# Patient Record
Sex: Male | Born: 1980 | Race: Black or African American | Hispanic: No | Marital: Single | Smoking: Current every day smoker
Health system: Southern US, Community
[De-identification: ages and names within clinical notes are randomized; demographics above are authoritative.]

## PROBLEM LIST (undated history)

## (undated) HISTORY — PX: FRACTURE SURGERY: SHX138

---

## 1999-03-18 ENCOUNTER — Emergency Department (HOSPITAL_COMMUNITY): Admission: EM | Admit: 1999-03-18 | Discharge: 1999-03-18 | Payer: Self-pay | Admitting: *Deleted

## 1999-11-05 ENCOUNTER — Emergency Department (HOSPITAL_COMMUNITY): Admission: EM | Admit: 1999-11-05 | Discharge: 1999-11-05 | Payer: Self-pay

## 2000-02-11 ENCOUNTER — Emergency Department (HOSPITAL_COMMUNITY): Admission: EM | Admit: 2000-02-11 | Discharge: 2000-02-11 | Payer: Self-pay | Admitting: Emergency Medicine

## 2000-04-17 ENCOUNTER — Emergency Department (HOSPITAL_COMMUNITY): Admission: EM | Admit: 2000-04-17 | Discharge: 2000-04-17 | Payer: Self-pay | Admitting: Internal Medicine

## 2001-05-24 ENCOUNTER — Emergency Department (HOSPITAL_COMMUNITY): Admission: EM | Admit: 2001-05-24 | Discharge: 2001-05-25 | Payer: Self-pay

## 2001-08-18 ENCOUNTER — Ambulatory Visit (HOSPITAL_BASED_OUTPATIENT_CLINIC_OR_DEPARTMENT_OTHER): Admission: RE | Admit: 2001-08-18 | Discharge: 2001-08-18 | Payer: Self-pay | Admitting: Orthopedic Surgery

## 2001-08-18 ENCOUNTER — Emergency Department (HOSPITAL_COMMUNITY): Admission: EM | Admit: 2001-08-18 | Discharge: 2001-08-18 | Payer: Self-pay | Admitting: *Deleted

## 2001-08-25 ENCOUNTER — Encounter: Admission: RE | Admit: 2001-08-25 | Discharge: 2001-11-23 | Payer: Self-pay | Admitting: Orthopedic Surgery

## 2003-10-03 ENCOUNTER — Emergency Department (HOSPITAL_COMMUNITY): Admission: EM | Admit: 2003-10-03 | Discharge: 2003-10-03 | Payer: Self-pay | Admitting: Emergency Medicine

## 2003-10-28 ENCOUNTER — Emergency Department (HOSPITAL_COMMUNITY): Admission: EM | Admit: 2003-10-28 | Discharge: 2003-10-28 | Payer: Self-pay | Admitting: Emergency Medicine

## 2004-11-22 ENCOUNTER — Emergency Department (HOSPITAL_COMMUNITY): Admission: EM | Admit: 2004-11-22 | Discharge: 2004-11-23 | Payer: Self-pay | Admitting: Emergency Medicine

## 2004-11-25 ENCOUNTER — Ambulatory Visit (HOSPITAL_COMMUNITY): Admission: RE | Admit: 2004-11-25 | Discharge: 2004-11-25 | Payer: Self-pay | Admitting: Otolaryngology

## 2004-12-17 ENCOUNTER — Ambulatory Visit (HOSPITAL_COMMUNITY): Admission: RE | Admit: 2004-12-17 | Discharge: 2004-12-17 | Payer: Self-pay | Admitting: Otolaryngology

## 2005-01-05 ENCOUNTER — Emergency Department (HOSPITAL_COMMUNITY): Admission: EM | Admit: 2005-01-05 | Discharge: 2005-01-05 | Payer: Self-pay | Admitting: Emergency Medicine

## 2005-10-31 ENCOUNTER — Emergency Department (HOSPITAL_COMMUNITY): Admission: EM | Admit: 2005-10-31 | Discharge: 2005-10-31 | Payer: Self-pay | Admitting: Emergency Medicine

## 2008-01-02 ENCOUNTER — Emergency Department (HOSPITAL_COMMUNITY): Admission: EM | Admit: 2008-01-02 | Discharge: 2008-01-02 | Payer: Self-pay | Admitting: Emergency Medicine

## 2008-01-04 ENCOUNTER — Inpatient Hospital Stay (HOSPITAL_COMMUNITY): Admission: RE | Admit: 2008-01-04 | Discharge: 2008-01-07 | Payer: Self-pay | Admitting: Orthopaedic Surgery

## 2008-01-04 ENCOUNTER — Emergency Department (HOSPITAL_COMMUNITY): Admission: EM | Admit: 2008-01-04 | Discharge: 2008-01-04 | Payer: Self-pay | Admitting: Emergency Medicine

## 2008-01-07 ENCOUNTER — Emergency Department (HOSPITAL_COMMUNITY): Admission: EM | Admit: 2008-01-07 | Discharge: 2008-01-07 | Payer: Self-pay | Admitting: Emergency Medicine

## 2009-05-06 ENCOUNTER — Inpatient Hospital Stay (HOSPITAL_COMMUNITY): Admission: EM | Admit: 2009-05-06 | Discharge: 2009-05-08 | Payer: Self-pay | Admitting: Emergency Medicine

## 2010-05-24 ENCOUNTER — Inpatient Hospital Stay (INDEPENDENT_AMBULATORY_CARE_PROVIDER_SITE_OTHER)
Admission: RE | Admit: 2010-05-24 | Discharge: 2010-05-24 | Disposition: A | Discharge status: Home / Self Care | Payer: Self-pay | Source: Ambulatory Visit | Attending: Family Medicine | Admitting: Family Medicine

## 2010-05-24 DIAGNOSIS — S058X9A Other injuries of unspecified eye and orbit, initial encounter: Secondary | ICD-10-CM

## 2010-06-01 LAB — BASIC METABOLIC PANEL
Calcium: 8.4 mg/dL (ref 8.4–10.5)
Creatinine, Ser: 0.91 mg/dL (ref 0.4–1.5)
GFR calc Af Amer: >60 mL/min (ref >60)

## 2010-07-21 NOTE — Op Note (Signed)
Nathaniel Werner, Nathaniel Werner             ACCOUNT NO.:  [id number]   MEDICAL RECORD NO.:  [id number]          PATIENT TYPE:  INP   LOCATION:  3041                         FACILITY:  [facility]   PHYSICIAN:  Mark C. Ophelia Charter, M.D.    DATE OF BIRTH:  06-May-1980   DATE OF PROCEDURE:  01/04/2008  DATE OF DISCHARGE:                               OPERATIVE REPORT   PREOPERATIVE DIAGNOSIS:  Left hand volar abscess.   POSTOPERATIVE DIAGNOSIS:  Left hand volar abscess.   PROCEDURE:  Incision and fixed excisional debridement of left hand volar  abscess.   SURGEON:  Mark C. Ophelia Charter, MD   TOURNIQUET TIME:  30 minutes.   DRAINS:  None.   OPERATIVE PROCEDURE:  After induction of general anesthesia __________  placement of proximal arm tourniquet, standard prep with DuraPrep,  clindamycin was being dripped and after incision was made.  After time-  out, procedure was confirmed and a V-shaped incision was made over the  fifth MCP, had incorporating the purulent 3 x 3 cm abscess that was  tenting the dermis.  Medially purulent material was noted and cultures  were obtained and once clindamycin was finished dripping, the tourniquet  was inflated for visualization.  Flap stitch was placed in the corner  with 4-0 nylon and then a copious irrigation was performed.  Spreading  with blunt end of the hemostat in the midline down to the flexor tendon  sheath was performed.  Copious irrigation was then performed.  Once the  wound was cleaned, small #15 blade was used to make a small stab in the  sheath and there was no purulence and the sheath tendon looked normal,  finger was flexion extended.  Inspection more proximally showed some the  necrotic subcutaneous palmar fat which was thick removed.  Care was  taken with the neurovascular bundles, repeat irrigation was performed,  some milking from proximal performed with no evidence of the abscess  extended up into the hypothenar bursa.  Milking with the finger revealed  no evidence of extension up into the region of the proximal phalanx of  the finger.  After finishing out the irrigation with bulb syringe of 1  liter saline, some simple sutures were placed reapproximating them to  leave room for any egress of fluids.  Xeroform, 4 x 4s, __________,  Princess Perna, and Coban was applied for postoperative soft dressing.  Instrument and needle count was correct.  The patient tolerated the  procedure well.        Mark C. Ophelia Charter, M.D.  Electronically Signed     MCY/MEDQ  D:  01/04/2008  T:  01/05/2008  Job:  [id number]
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
## 2010-12-07 LAB — CBC
HCT: 38 — ABNORMAL LOW
HCT: 44.7
MCHC: 33.4
MCHC: 33.6
MCV: 96.3
MCV: 96.7
Platelets: 229
RBC: 3.93 — ABNORMAL LOW
RBC: 4.64
RDW: 12.2
RDW: 12.3
WBC: 12 — ABNORMAL HIGH
WBC: 6.1

## 2010-12-07 LAB — CULTURE, ROUTINE-ABSCESS

## 2010-12-07 LAB — DIFFERENTIAL
Basophils Absolute: 0
Basophils Relative: 0
Eosinophils Relative: 1
Lymphocytes Relative: 30
Monocytes Absolute: 1
Neutro Abs: 7.2
Neutrophils Relative %: 60

## 2010-12-07 LAB — ANAEROBIC CULTURE

## 2011-04-19 ENCOUNTER — Emergency Department (HOSPITAL_COMMUNITY)
Admission: EM | Admit: 2011-04-19 | Discharge: 2011-04-19 | Discharge status: Against Medical Advice | Payer: Self-pay | Attending: Emergency Medicine | Admitting: Emergency Medicine

## 2011-04-19 DIAGNOSIS — Z0389 Encounter for observation for other suspected diseases and conditions ruled out: Secondary | ICD-10-CM | POA: Insufficient documentation

## 2014-03-09 ENCOUNTER — Encounter (HOSPITAL_COMMUNITY): Payer: Self-pay | Admitting: Emergency Medicine

## 2014-03-09 ENCOUNTER — Emergency Department (HOSPITAL_COMMUNITY)
Admission: EM | Admit: 2014-03-09 | Discharge: 2014-03-09 | Disposition: A | Discharge status: Home / Self Care | Payer: Self-pay | Attending: Emergency Medicine | Admitting: Emergency Medicine

## 2014-03-09 DIAGNOSIS — K029 Dental caries, unspecified: Secondary | ICD-10-CM | POA: Insufficient documentation

## 2014-03-09 DIAGNOSIS — J029 Acute pharyngitis, unspecified: Secondary | ICD-10-CM | POA: Insufficient documentation

## 2014-03-09 DIAGNOSIS — Z720 Tobacco use: Secondary | ICD-10-CM | POA: Insufficient documentation

## 2014-03-09 DIAGNOSIS — K0889 Other specified disorders of teeth and supporting structures: Secondary | ICD-10-CM

## 2014-03-09 DIAGNOSIS — K088 Other specified disorders of teeth and supporting structures: Secondary | ICD-10-CM | POA: Insufficient documentation

## 2014-03-09 MED ORDER — IBUPROFEN 200 MG PO TABS
600.0000 mg | ORAL_TABLET | Freq: Once | ORAL | Status: AC
  Administered 2014-03-09: 600 mg via ORAL
  Filled 2014-03-09: qty 3

## 2014-03-09 MED ORDER — HYDROCODONE-ACETAMINOPHEN 5-325 MG PO TABS: 1.0000 | ORAL_TABLET | ORAL | Status: DC | PRN

## 2014-03-09 MED ORDER — PENICILLIN V POTASSIUM 500 MG PO TABS: 500.0000 mg | ORAL_TABLET | Freq: Four times a day (QID) | ORAL | Status: DC

## 2014-03-09 MED ORDER — IBUPROFEN 800 MG PO TABS: 800.0000 mg | ORAL_TABLET | Freq: Three times a day (TID) | ORAL | Status: DC | PRN

## 2014-03-09 NOTE — ED Notes (Addendum)
Patient chipped his tooth on the left side approximately a year ago. Now is c/o severe left upper dental pain. Has not taken any pain medicine today. Took BC powder without alleviation of symptoms. RR even-unlabored. No other questions/concerns. Does not have a dentist and has not seen once since chipping his tooth.

## 2014-03-09 NOTE — ED Provider Notes (Signed)
CSN: [id number]     Arrival date & time 03/09/14  1212 History  This chart was scribed for Trixie Dredge, PA-C, working with Toy Cookey, MD by Elon Spanner, ED Scribe. This patient was seen in room WTR5/WTR5 and the patient's care was started at 1:08 PM.   Chief Complaint  Patient presents with  . Dental Pain   The history is provided by the patient. No language interpreter was used.   HPI Comments: Nathaniel Werner is a 34 y.o. male who presents to the Emergency Department complaining of intermittent 10/10 left upper dental pain for several years with worsening the past month and significantly over the past three days.  He describes the pain as aching/throbbing and states it is aggravated by eating and drinking. He also reports an associated sore throat onset this morning.    Patient has taken a BC powder this morning without improvement.  Patient denies dental drainage, facial swelling, sinus congestion, cough, difficulty swallowing or breathing, or fevers.     History reviewed. No pertinent past medical history. Past Surgical History  Procedure Laterality Date  . Fracture surgery     History reviewed. No pertinent family history. History  Substance Use Topics  . Smoking status: Current Every Day Smoker -- 1.00 packs/day    Types: Cigarettes  . Smokeless tobacco: Not on file  . Alcohol Use: Yes     Comment: occasionally    Review of Systems  Constitutional: Negative for fever and chills.  HENT: Positive for dental problem and sore throat. Negative for congestion, facial swelling, sinus pressure and trouble swallowing.   Respiratory: Negative for cough.   Gastrointestinal: Negative for nausea and vomiting.  Musculoskeletal: Negative for myalgias, neck pain and neck stiffness.  Skin: Negative for color change and wound.  Allergic/Immunologic: Negative for immunocompromised state.      Allergies  Morphine and related  Home Medications   Prior to Admission medications    Medication Sig Start Date End Date Taking? Authorizing Provider  Aspirin-Salicylamide-Caffeine (BC HEADACHE POWDER PO) Take 1 each by mouth once.   Yes Historical Provider, MD   BP 160/109 mmHg  Pulse 76  Temp(Src) 98.1 F (36.7 C) (Oral)  Resp 16  SpO2 100% Physical Exam  Constitutional: He appears well-developed and well-nourished. No distress.  HENT:  Head: Normocephalic and atraumatic.  Mouth/Throat: Uvula is midline and oropharynx is clear and moist. Mucous membranes are not dry. No trismus in the jaw. Dental caries present. No uvula swelling. No oropharyngeal exudate, posterior oropharyngeal edema, posterior oropharyngeal erythema or tonsillar abscesses.  Left upper second molar with severe decay and tender to percussion.  No trismus, facial swelling, peritracheal tendneress no edema or erythma of anterior neck.    Neck: Normal range of motion. Neck supple.  No anterior cervical adenopathy.    Cardiovascular: Normal rate, regular rhythm and normal heart sounds.   Pulmonary/Chest: Effort normal and breath sounds normal. No stridor. No respiratory distress. He has no wheezes.  CTA.  No stridor or wheezing.    Lymphadenopathy:    He has no cervical adenopathy.  Neurological: He is alert.  Skin: He is not diaphoretic. No erythema.  Nursing note and vitals reviewed.   ED Course  Procedures (including critical care time)  DIAGNOSTIC STUDIES: Oxygen Saturation is 100% on RA, normal by my interpretation.    COORDINATION OF CARE:  1:11 PM Will order antibiotics and prescribe pain medication.  Patient advised to follow-up with dentist.  Patient acknowledges and agrees with  plan.    Labs Review Labs Reviewed - No data to display  Imaging Review No results found.   EKG Interpretation None      MDM   Final diagnoses:  Pain, dental  Dental caries    Afebrile, nontoxic patient with new dental pain.  No obvious abscess.  No concerning findings on exam.  Doubt deep space  head or neck infection.  Doubt Ludwig's angina.  D/C home with antibiotic, pain medication and dental follow up.  Discussed findings, treatment, and follow up  with patient.  Pt given return precautions.  Pt verbalizes understanding and agrees with plan.       I personally performed the services described in this documentation, which was scribed in my presence. The recorded information has been reviewed and is accurate.   Trixie Dredge, PA-C 03/09/14 1424  Toy Cookey, MD 03/09/14 1721

## 2014-03-09 NOTE — Discharge Instructions (Signed)
Read the information below.  Use the prescribed medication as directed.  Please discuss all new medications with your pharmacist.  Do not take additional tylenol while taking the prescribed pain medication to avoid overdose.  You may return to the Emergency Department at any time for worsening condition or any new symptoms that concern you.  Please call the dentist listed above within 48 hours to schedule a close follow up appointment.  If you develop fevers, swelling in your face, difficulty swallowing or breathing, return to the ER immediately for a recheck.   Dental Caries Dental caries is tooth decay. This decay can cause a hole in teeth (cavity) that can get bigger and deeper over time. HOME CARE  Brush and floss your teeth. Do this at least two times a day.  Use a fluoride toothpaste.  Use a mouth rinse if told by your dentist or doctor.  Eat less sugary and starchy foods. Drink less sugary drinks.  Avoid snacking often on sugary and starchy foods. Avoid sipping often on sugary drinks.  Keep regular checkups and cleanings with your dentist.  Use fluoride supplements if told by your dentist or doctor.  Allow fluoride to be applied to teeth if told by your dentist or doctor. Document Released: 12/02/2007 Document Revised: 07/09/2013 Document Reviewed: 02/25/2012 [facility] Patient Information 2015 [place], [place]. This information is not intended to replace advice given to you by your health care provider. Make sure you discuss any questions you have with your health care provider.  Dental Pain Toothache is pain in or around a tooth. It may get worse with chewing or with cold or heat.  HOME CARE  Your dentist may use a numbing medicine during treatment. If so, you may need to avoid eating until the medicine wears off. Ask your dentist about this.  Only take medicine as told by your dentist or doctor.  Avoid chewing food near the painful tooth until after all treatment is done. Ask  your dentist about this. GET HELP RIGHT AWAY IF:   The problem gets worse or new problems appear.  You have a fever.  There is redness and puffiness (swelling) of the face, jaw, or neck.  You cannot open your mouth.  There is pain in the jaw.  There is very bad pain that is not helped by medicine. MAKE SURE YOU:   Understand these instructions.  Will watch your condition.  Will get help right away if you are not doing well or get worse. Document Released: 08/11/2007 Document Revised: 05/17/2011 Document Reviewed: 08/11/2007 [facility] Patient Information 2015 [place], [place]. This information is not intended to replace advice given to you by your health care provider. Make sure you discuss any questions you have with your health care provider.    Emergency Department Resource Guide 1) Find a Doctor and Pay Out of Pocket Although you won't have to find out who is covered by your insurance plan, it is a good idea to ask around and get recommendations. You will then need to call the office and see if the doctor you have chosen will accept you as a new patient and what types of options they offer for patients who are self-pay. Some doctors offer discounts or will set up payment plans for their patients who do not have insurance, but you will need to ask so you aren't surprised when you get to your appointment.  2) Contact Your Local Health Department Not all health departments have doctors that can see patients for  sick visits, but many do, so it is worth a call to see if yours does. If you don't know where your local health department is, you can check in your phone book. The CDC also has a tool to help you locate your state's health department, and many state websites also have listings of all of their local health departments.  3) Find a Walk-in Clinic If your illness is not likely to be very severe or complicated, you may want to try a walk in clinic. These are popping up all over the  country in pharmacies, drugstores, and shopping centers. They're usually staffed by nurse practitioners or physician assistants that have been trained to treat common illnesses and complaints. They're usually fairly quick and inexpensive. However, if you have serious medical issues or chronic medical problems, these are probably not your best option.  No Primary Care Doctor: - Call Health Connect at  [phone] - they can help you locate a primary care doctor that  accepts your insurance, provides certain services, etc. - Physician Referral Service- [phone]  Chronic Pain Problems: Organization         Address  Phone   Notes  [facility]  [phone] Patients need to be referred by their primary care doctor.   Medication Assistance: Organization         Address  Phone   Notes  [facility] Medication [facility] [address] [place] [place], [place] [address] [phone] --Must be a resident of [facility] -- Must have NO insurance coverage whatsoever (no Medicaid/ Medicare, etc.) -- The pt. MUST have a primary care doctor that directs their care regularly and follows them in the community   [facility]  [phone]   [employer]  [phone]    Agencies that provide inexpensive medical care: Organization         Address  Phone   Notes  [facility]  [phone]   [facility]    [phone]   [facility] [address] [place], [place] [address] [phone]   [facility] [address] [place] [phone]   Planned Parenthood    [phone]   [facility]    [phone]   [facility]  [address], [place] Phone:  [phone], Fax:  [phone] Hours of Operation:  9 am - 6 pm, M-F.  Also accepts Medicaid/Medicare and self-pay.  [facility]  [address], [place] Phone: [phone], Fax: [phone]. Hours of Operation:  8:30 am - 5:30 pm, M-F.  Also accepts Medicaid and self-pay.  [facility] [place] [address] [place] Phone: [phone]   [facility] [address] Natasha Bence [place], [place] [phone], Ext. [phone] Mondays & Thursdays: 7-9 AM.  First 15 patients are seen on a first come, first serve basis.    Medicaid-accepting [facility] Providers:  Organization         Address  Phone   Notes  [facility] [address], [place] [phone] Also accepts self-pay patients.  [facility] [address] Laurell Josephs [place], [place]  [phone]   [facility] [address], [place] [phone]   [facility] [address] [place] [phone]  Renaye Rakers [address], [place]   [phone] Only accepts [place] patients after they have their name applied to their card.   Self-Pay (no insurance) in [facility]:  Organization         Address  Phone   Notes  Sickle Cell Patients, [facility] [address] [place], [place] [phone]   [facility] [address] [place], [place] [phone]   [facility]  [address], [place] [phone]   [facility]/Dr. Osei-Bonsu  [address] [place] or [address], [place] [phone] Phone number for both [place] and [place] locations is the same.  [facility] and [facility] [address] [place] [phone]   [facility] [address] [place] or [address] [phone]   [facility] [address] [place] [phone], phone; [phone], fax Sees patients 1st and 3rd Saturday of every month.  Must  not qualify for public or private insurance (i.e. Medicaid, Medicare, [place] Health Choice, Veterans' Benefits)  Household income should be no more than 200% of the poverty level The clinic cannot treat you if you are pregnant or think you are pregnant  Sexually transmitted diseases are not treated at the clinic.    Dental Care: Organization         Address  Phone  Notes  [facility] Department of [facility] [place] [address] [place], [place] [phone] Accepts children up to age 36 who are enrolled in [place] or [place] Health Choice; pregnant women with a Medicaid card; and children who have applied for Medicaid or [place] Health Choice, but were declined, whose parents can pay a reduced fee at time of service.  [facility]  [address], [place] [phone] Accepts children up to age 52 who are enrolled in [place] or [place] Health Choice; pregnant women with a Medicaid card; and children who have applied for Medicaid or [place] Health Choice, but were declined, whose parents can pay a reduced fee at time of service.  [facility]  [address] [place], [place] [phone] Patients are seen by appointment only. Walk-ins are not accepted. [facility] will see patients 6 years of age and older. Monday - Tuesday (8am-5pm) Most Wednesdays (8:30-5pm) $30 per visit, cash only  [facility] Adult [facility] Access [facility]  [address], [facility] [phone] Patients are seen by appointment only. Walk-ins are not accepted. [facility] will see patients 75 years of age and older. One Wednesday Evening (Monthly: Volunteer Based).  $30 per visit, cash only  [employer]  [phone] for adults; Children under age 57, call Graduate Pediatric Dentistry at [phone]. Children aged 4-14, please call [phone] to request a pediatric application.  Dental services are  provided in all areas of dental care including fillings, crowns and bridges, complete and partial dentures, implants, gum treatment, root canals, and extractions. Preventive care is also provided. Treatment is provided to both adults and children. Patients are selected via a lottery and there is often a waiting list.   [facility] [address] [place]  [phone] [email or web address]   [facility] [address] [place], [place] [phone], Ext.  123 Second and Fourth Thursday of each month, opens at 6:30 AM; Clinic ends at 9 AM.  Patients are seen on a first-come first-served basis, and a limited number are seen during each clinic.   [facility]  [address] Ether Griffins [place], [place] [phone]   Eligibility Requirements You must have lived in [place], [place], or [place] counties for at least the last three months.   You cannot be eligible for state or federal sponsored [place], including [employer], [place], or [employer].   You generally cannot be eligible for healthcare insurance through your employer.    How to apply: Eligibility screenings are held every Tuesday and Wednesday afternoon from 1:00 pm until 4:00 pm. You do not need an appointment for the interview!  [facility] [address] [place], [place] [phone]   [facility] Health Department  [phone]   [facility] Health Department  [phone]   [facility] Health Department  [phone]    Behavioral Health Resources in the Community: Intensive Outpatient Programs Organization         Address  Phone  Notes  [facility] [address] [place], [place] [phone]   [facility] [address], [place], [place] [phone]   [facility] [address] [place], [place]  [phone]   [facility] [address],  [place], [place]  [phone] or [phone]   Substance Abuse Resources Organization         Address  Phone  Notes  [facility]  [phone]   [facility]  [phone]   [facility]  [phone]   Floydene Flock  [phone]   Residential & Outpatient Substance Abuse Program  [phone]   Psychological Services Organization         Address  Phone  Notes  [facility]  [phone]   [facility]  [phone]   [facility] [address], [place] [phone] or [phone]    Mobile Crisis Teams Organization         Address  Phone  Notes  [facility], Mobile Crisis Care Unit  [phone]   [facility]  [address] [place], [place] [phone]   Doristine Locks [address] [place] [phone]    Self-Help/Support Groups Organization         Address  Phone             Notes  [facility] [place] - variety of support groups  336- [phone] Call for more information  Narcotics Anonymous (NA), [facility] [address], [employer] [place]  2 meetings at this location   Statistician         Address  Phone  Notes  [facility] [address],    [place]  [phone]   [facility]  [address] [place] [address], [place], [place] [phone]   [facility] [address] [place], [place] [phone] Admissions: 8am-3pm M-F  [facility] [address],    [place], [place] [phone]   [facility] [address] Starling Manns [place], [place] [phone]   [facility] [address]  [place], [place] [phone]   [facility] - Intensive Outpatient [address] Laurell Josephs [address], [place], [place] [phone]   [facility]  [address]  [place], [place] [phone] or  [phone]   [facility] ([facility]) [address], [place], [place] [phone] Accepts Medicaid  [facility] [address]  [place] [phone] Substance Abuse/Addiction Treatment   [facility] Organization         Address  Phone  Notes  [facility]  [phone]   Angie Fava, PhD [address] Ervin Knack [place], [place]   [phone] or [phone]   [facility] Behavioral   [address] [place], [place] [phone]   [facility] [address], [place], [place] [phone] Insurance/Medicaid/sponsorship through [facility] [address]                                    [place], [place] [phone] Therapy/tele-psych/case  [facility] [address], [place] [phone]    Dr. Lolly Mustache  [phone]   [facility]  [employer] [facility] Dept. 1) [address] [place] 2) [address] [place] 3)  [address], [place] [phone]  [phone]   [facility] Child Abuse Hotline [phone] or [phone] (After Hours)

## 2019-07-30 ENCOUNTER — Ambulatory Visit (HOSPITAL_COMMUNITY): Admission: EM | Admit: 2019-07-30 | Discharge: 2019-07-30 | Discharge status: Against Medical Advice | Payer: Self-pay

## 2019-07-30 ENCOUNTER — Other Ambulatory Visit: Payer: Self-pay

## 2019-09-15 ENCOUNTER — Emergency Department (HOSPITAL_COMMUNITY)
Admission: EM | Admit: 2019-09-15 | Discharge: 2019-09-16 | Disposition: A | Discharge status: Home / Self Care | Payer: Self-pay | Attending: Emergency Medicine | Admitting: Emergency Medicine

## 2019-09-15 ENCOUNTER — Encounter (HOSPITAL_COMMUNITY): Payer: Self-pay | Admitting: Emergency Medicine

## 2019-09-15 ENCOUNTER — Other Ambulatory Visit: Payer: Self-pay

## 2019-09-15 DIAGNOSIS — L089 Local infection of the skin and subcutaneous tissue, unspecified: Secondary | ICD-10-CM | POA: Insufficient documentation

## 2019-09-15 DIAGNOSIS — Z5321 Procedure and treatment not carried out due to patient leaving prior to being seen by health care provider: Secondary | ICD-10-CM | POA: Insufficient documentation

## 2019-09-15 NOTE — ED Triage Notes (Signed)
Pt reports a spider bite that appeared on his head three weeks ago.

## 2019-09-16 NOTE — ED Notes (Signed)
Called pt 2X to be roomed. No answer

## 2019-12-16 ENCOUNTER — Other Ambulatory Visit: Payer: Self-pay

## 2019-12-16 ENCOUNTER — Encounter (HOSPITAL_COMMUNITY): Payer: Self-pay | Admitting: Emergency Medicine

## 2019-12-16 DIAGNOSIS — W57XXXA Bitten or stung by nonvenomous insect and other nonvenomous arthropods, initial encounter: Secondary | ICD-10-CM | POA: Insufficient documentation

## 2019-12-16 DIAGNOSIS — L03116 Cellulitis of left lower limb: Secondary | ICD-10-CM | POA: Insufficient documentation

## 2019-12-16 DIAGNOSIS — Z7982 Long term (current) use of aspirin: Secondary | ICD-10-CM | POA: Insufficient documentation

## 2019-12-16 DIAGNOSIS — F17210 Nicotine dependence, cigarettes, uncomplicated: Secondary | ICD-10-CM | POA: Insufficient documentation

## 2019-12-16 DIAGNOSIS — L02416 Cutaneous abscess of left lower limb: Secondary | ICD-10-CM | POA: Insufficient documentation

## 2019-12-16 NOTE — ED Triage Notes (Addendum)
Pt reports that he thinks a spider bit him on his L knee on Friday. Area is swollen, erythematous, and tender. Swelling covers his whole knee cap. He did not see what bit him. 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

## 2019-12-17 ENCOUNTER — Emergency Department (HOSPITAL_COMMUNITY)
Admission: EM | Admit: 2019-12-17 | Discharge: 2019-12-17 | Disposition: A | Discharge status: Home / Self Care | Payer: Self-pay | Attending: Emergency Medicine | Admitting: Emergency Medicine

## 2019-12-17 ENCOUNTER — Emergency Department (HOSPITAL_COMMUNITY): Payer: Self-pay

## 2019-12-17 DIAGNOSIS — L03116 Cellulitis of left lower limb: Secondary | ICD-10-CM

## 2019-12-17 DIAGNOSIS — L02416 Cutaneous abscess of left lower limb: Secondary | ICD-10-CM

## 2019-12-17 MED ORDER — SULFAMETHOXAZOLE-TRIMETHOPRIM 800-160 MG PO TABS: 1.0000 | ORAL_TABLET | Freq: Two times a day (BID) | ORAL | 0 refills | Status: AC

## 2019-12-17 MED ORDER — SULFAMETHOXAZOLE-TRIMETHOPRIM 800-160 MG PO TABS
1.0000 | ORAL_TABLET | Freq: Once | ORAL | Status: AC
  Administered 2019-12-17: 1 via ORAL
  Filled 2019-12-17: qty 1

## 2019-12-17 MED ORDER — HYDROCODONE-ACETAMINOPHEN 5-325 MG PO TABS
2.0000 | ORAL_TABLET | Freq: Once | ORAL | Status: AC
  Administered 2019-12-17: 2 via ORAL
  Filled 2019-12-17: qty 2

## 2019-12-17 MED ORDER — LIDOCAINE-EPINEPHRINE (PF) 2 %-1:200000 IJ SOLN
20.0000 mL | Freq: Once | INTRAMUSCULAR | Status: AC
  Administered 2019-12-17: 20 mL
  Filled 2019-12-17: qty 20

## 2019-12-17 MED ORDER — CEPHALEXIN 500 MG PO CAPS: 500.0000 mg | ORAL_CAPSULE | Freq: Three times a day (TID) | ORAL | 0 refills | Status: DC

## 2019-12-17 MED ORDER — HYDROCODONE-ACETAMINOPHEN 5-325 MG PO TABS: 1.0000 | ORAL_TABLET | Freq: Four times a day (QID) | ORAL | 0 refills | Status: DC | PRN

## 2019-12-17 MED ORDER — CEPHALEXIN 500 MG PO CAPS
500.0000 mg | ORAL_CAPSULE | Freq: Once | ORAL | Status: AC
  Administered 2019-12-17: 500 mg via ORAL
  Filled 2019-12-17: qty 1

## 2019-12-17 NOTE — ED Provider Notes (Signed)
[facility]-EMERGENC[facility] DEPT Provider Note   CSN: [id number] Arrival date & time: 12/16/19  2336     History Chief Complaint  Patient presents with  . Insect Bite    Nathaniel Werner is a 39 y.o. male.  The history is provided by the patient.  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  He thinks he got bit by a spider in the left knee 2 days ago.  He reports over the past day he has had increased pain and swelling in the left knee.  No fevers or vomiting.  Has never had surgery on his left knee. He is not diabetic     PMH-none Past Surgical History:  Procedure Laterality Date  . FRACTURE SURGERY         History reviewed. No pertinent family history.  Social History   Tobacco Use  . Smoking status: Current Every Day Smoker    Packs/day: 1.00    Types: Cigarettes  Substance Use Topics  . Alcohol use: Yes    Comment: occasionally  . Drug use: Not on file    Home Medications Prior to Admission medications   Medication Sig Start Date End Date Taking? Authorizing Provider  Aspirin-Salicylamide-Caffeine (BC HEADACHE POWDER PO) Take 1 each by mouth once.    [provider]  HYDROcodone-acetaminophen (NORCO/VICODIN) 5-325 MG per tablet Take 1 tablet by mouth every 4 (four) hours as needed for moderate pain or severe pain. 03/09/14   Trixie Dredge, PA-C  ibuprofen (ADVIL,MOTRIN) 800 MG tablet Take 1 tablet (800 mg total) by mouth every 8 (eight) hours as needed for mild pain or moderate pain. 03/09/14   Trixie Dredge, PA-C  penicillin v potassium (VEETID) 500  MG tablet Take 1 tablet (500 mg total) by mouth 4 (four) times daily. 03/09/14   Trixie Dredge, PA-C    Allergies    Morphine and related  Review of Systems   Review of Systems  Constitutional: Negative for fever.  Gastrointestinal: Negative for vomiting.  Skin: Positive for wound.  All other systems reviewed and are negative.   Physical Exam Updated Vital Signs BP 133/88 (BP Location: Left Arm)   Pulse 85   Temp 98.2 F (36.8 C) (Oral)   Resp 18   Ht 1.626 m (5\' 4" )   Wt 68 kg   SpO2 100%   BMI 25.75 kg/m   Physical Exam CONSTITUTIONAL: Well developed/well nourished HEAD: Normocephalic/atraumatic EYES: EOMI ENMT: Mucous membranes moist NECK: supple no meningeal signs CV: S1/S2 noted, no murmurs/rubs/gallops noted LUNGS: Lungs are clear to auscultation bilaterally, no apparent distress ABDOMEN: soft, nontender NEURO: Pt is awake/alert/appropriate, moves all extremitiesx4.  No facial droop.   EXTREMITIES: pulses normal/equal, full ROM, tenderness and swelling to left knee.  He has a fluctuant abscess on the distal femur.  There is surrounding erythema.  No crepitus.  He is able to flex the left knee SKIN: warm, color normal, see photo below PSYCH: no abnormalities of mood noted, alert and oriented to situation  Patient gave verbal permission to utilize photo for medical  documentation only The image was not stored on any personal device       ED Results / Procedures / Treatments   Labs (all labs ordered are listed, but only abnormal results are displayed) Labs Reviewed - No data to display  EKG None  Radiology DG Knee Complete 4 Views Left  Result Date: 12/17/2019 CLINICAL DATA:  Spider bite, left knee, swelling, erythema and tenderness EXAM: LEFT KNEE - COMPLETE 4+ VIEW COMPARISON:  None. FINDINGS: Focal soft tissue swelling is seen anterior to the distal quadriceps tendon and patella with some questionable thickening of the prepatellar bursa as well. Mild  thickening of the distal quadriceps tendon itself. No soft tissue gas or foreign body is seen. No acute bony abnormality. Specifically, no fracture, subluxation, or dislocation. No joint effusion or intra-articular gas. No erosive or destructive changes to suggest features of osteomyelitis. IMPRESSION: Focal edematous swelling anteriorly with thickening of the prepatellar bursa which could reflect a reactive bursitis. Correlate for clinical features of cellulitis. Mild nonspecific thickening of the distal quadriceps tendon itself, could reflect a reactive tenosynovitis which would typically be characterized by exquisite pain with motion. No soft tissue gas or foreign body. No acute osseous abnormality. Electronically Signed   By: Kreg Shropshire M.D.   On: 12/17/2019 00:30    Procedures .Marland KitchenIncision and Drainage  Date/Time: 12/17/2019 12:19 AM Performed by: Zadie Rhine, MD Authorized by: Zadie Rhine, MD   Consent:    Consent obtained:  Verbal   Consent given by:  Patient   Risks discussed:  Incomplete drainage and pain Location:    Type:  Abscess   Location:  Lower extremity   Lower extremity location:  Knee   Knee location:  L knee Pre-procedure details:    Skin preparation:  Betadine and antiseptic wash Procedure type:    Complexity:  Simple Procedure details:    Incision types:  Single straight   Scalpel blade:  11   Wound management:  Extensive cleaning   Drainage:  Purulent   Drainage amount:  Scant   Wound treatment:  Wound left open   Packing materials:  None Post-procedure details:    Patient tolerance of procedure:  Tolerated well, no immediate complications    Medications Ordered in ED Medications  HYDROcodone-acetaminophen (NORCO/VICODIN) 5-325 MG per tablet 2 tablet (2 tablets Oral Given 12/17/19 0023)  sulfamethoxazole-trimethoprim (BACTRIM DS) 800-160 MG per tablet 1 tablet (1 tablet Oral Given 12/17/19 0023)  cephALEXin (KEFLEX) capsule 500 mg (500 mg Oral  Given 12/17/19 0023)  lidocaine-EPINEPHrine (XYLOCAINE W/EPI) 2 %-1:200000 (PF) injection 20 mL (20 mLs Infiltration Given by Other 12/17/19 0023)    ED Course  I have reviewed the triage vital signs and the nursing notes.  Pertinent  imaging results that were available during my care of the patient were reviewed by me and considered in my medical decision making (see chart for details).    MDM Rules/Calculators/A&P                          12:19 AM Suspect patient has cutaneous abscess that is causing localized erythema.  I have low suspicion for septic joint.  Will obtain x-ray of knee and reassess 1:34 AM I personally reviewed the x-ray.  There is no joint effusion.  Strong suspicion this represents a cutaneous abscess.  Bedside ultrasound revealed only minimal fluid in the area of fluctuance.  Made a small incision in that area with small amount of blood and  pus extracted.  Patient is able to flex and extend the knee. He will need to continue dual antibiotic therapy and good wound care.  I advised him that he will likely need to have a second I&D in the  next several days.  Final Clinical Impression(s) / ED Diagnoses Final diagnoses:  Abscess of left knee  Cellulitis of left lower extremity    Rx / DC Orders ED Discharge Orders         Ordered    HYDROcodone-acetaminophen (NORCO/VICODIN) 5-325 MG tablet  Every 6 hours PRN        12/17/19 0132    cephALEXin (KEFLEX) 500 MG capsule  3 times daily        12/17/19 0132    sulfamethoxazole-trimethoprim (BACTRIM DS) 800-160 MG tablet  2 times daily        12/17/19 0132           Zadie Rhine, MD 12/17/19 0136

## 2019-12-17 NOTE — Discharge Instructions (Addendum)
Please keep your leg elevated.  Check your bandage each day to see if it is draining fluid.  You can put warm compresses or warm cloths on the wound several times a day to help draw the fluid out.  Take a photo of the wound each day to make sure it is getting better.  If you have worsening pain, redness or swelling over the next 2 to 3 days please return to the ER

## 2019-12-17 NOTE — ED Notes (Signed)
Opened  Chart at pts request to clarify pharmacy prescriptions were sent to.

## 2020-01-07 ENCOUNTER — Emergency Department (HOSPITAL_COMMUNITY)
Admission: EM | Admit: 2020-01-07 | Discharge: 2020-01-08 | Disposition: A | Discharge status: Home / Self Care | Payer: Self-pay | Attending: Emergency Medicine | Admitting: Emergency Medicine

## 2020-01-07 ENCOUNTER — Other Ambulatory Visit: Payer: Self-pay

## 2020-01-07 DIAGNOSIS — F17210 Nicotine dependence, cigarettes, uncomplicated: Secondary | ICD-10-CM | POA: Diagnosis not present

## 2020-01-07 DIAGNOSIS — G8929 Other chronic pain: Secondary | ICD-10-CM

## 2020-01-07 DIAGNOSIS — G44209 Tension-type headache, unspecified, not intractable: Secondary | ICD-10-CM | POA: Diagnosis not present

## 2020-01-07 DIAGNOSIS — R519 Headache, unspecified: Secondary | ICD-10-CM | POA: Diagnosis present
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
## 2020-01-08 ENCOUNTER — Emergency Department (HOSPITAL_COMMUNITY): Payer: Self-pay

## 2020-01-08 ENCOUNTER — Encounter (HOSPITAL_COMMUNITY): Payer: Self-pay

## 2020-01-08 NOTE — Discharge Instructions (Addendum)
You were seen in the emergency department today for headaches. Your  CT scan did not show any new abnormality such as a brain bleed.  We would like you to follow-up with neurology & the [facility], contact information below:  Address: [address], [place], [place] [address] Phone: [phone]  Per [facility] Website:   What to Expect: Evaluations at the Concussion Clinic All patients at the Concussion Clinic are given an extensive three-part evaluation that includes: a computerized test to measure memory, visual processing speed, and reaction time a test that measures the systems that integrate movement, balance, and vision an in-depth review of a detailed symptoms checklist for signs of concussion The evaluation process is critical for the treatment and recovery of a concussed patient as no two concussions are alike. Thankfully, the diagnostic tools that trained professionals use can help to better manage head injuries.  Part of the technology the doctors and staff at [facility] use in their assessments is a computerized examination called ImPACT. This tool uses six tasks to measure memory, visual processing speed, and reaction time. By analyzing the results of the examination and comparing them to average responses or a baseline score for a patient, our staff can make an informed judgment about the patient's cognitive functions. In addition to the ImPACT examination, patients are given a Vestibular Ocular Motor Screening test. This is a simple and painless test that focuses on the systems that integrate a patient's movement, balance, and vision.  These tests are used in conjunction with a thorough review of a detailed symptoms checklist to complete the patient's evaluation and develop a treatment plan.  You do not need a referral, and you can book an appointment online. Our Concussion Hotline is staffed by trained  professionals during our regular office hours: Monday - Thursday from 7:30 AM to 4:30 PM, and Fridays from 7:30 AM to 12:00 PM, and the number to call is [phone]. The Concussion Clinic team meets with patients at our [facility] office. Call today.   Further ED Instructions:   Please follow up within 1 week. In the meantime we would like you to avoid strenuous/over exertional activities such as sports or running.  Please avoid excess screen time utilizing cell phones, computers, or the TV.  Please avoid activities that require significant amount of concentration.  Please try to rest as much as possible.  Please take Tylenol and/or Motrin per over-the-counter dosing instructions for any continued discomfort.  Return to the ER for new or worsening symptoms or any other concerns that you may have.   Please have your blood pressure rechecked at your follow-up appointment as it was elevated today.  Vitals:   01/07/20 2334  BP: (!) 156/116  Pulse: 96  Resp: 20  Temp: 98.1 F (36.7 C)  SpO2: 100%
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
## 2020-01-28 ENCOUNTER — Encounter: Payer: Self-pay | Admitting: Neurology

## 2020-01-28 ENCOUNTER — Telehealth: Payer: Self-pay
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
## 2020-03-06 ENCOUNTER — Other Ambulatory Visit: Payer: Self-pay

## 2020-03-06 ENCOUNTER — Encounter (HOSPITAL_COMMUNITY): Payer: Self-pay | Admitting: Emergency Medicine

## 2020-03-06 ENCOUNTER — Emergency Department (HOSPITAL_COMMUNITY)
Admission: EM | Admit: 2020-03-06 | Discharge: 2020-03-06 | Disposition: A | Discharge status: Home / Self Care | Payer: Self-pay | Attending: Emergency Medicine | Admitting: Emergency Medicine

## 2020-03-06 DIAGNOSIS — J029 Acute pharyngitis, unspecified: Secondary | ICD-10-CM | POA: Insufficient documentation

## 2020-03-06 DIAGNOSIS — Z5321 Procedure and treatment not carried out due to patient leaving prior to being seen by health care provider: Secondary | ICD-10-CM | POA: Insufficient documentation

## 2020-03-06 LAB — GROUP A STREP BY PCR: Group A Strep by PCR: NOT DETECTED

## 2020-03-06 NOTE — ED Notes (Signed)
Pt called 2x no answer 

## 2020-03-06 NOTE — ED Notes (Addendum)
Pt called 3x no answer  

## 2020-03-06 NOTE — ED Triage Notes (Signed)
Patient reports sore throat with swelling onset last week , no fever or chills , airway intact , respirations unlabored .

## 2020-03-13 ENCOUNTER — Emergency Department (HOSPITAL_COMMUNITY)
Admission: EM | Admit: 2020-03-13 | Discharge: 2020-03-13 | Disposition: A | Discharge status: Home / Self Care | Payer: Self-pay | Attending: Emergency Medicine | Admitting: Emergency Medicine

## 2020-03-13 ENCOUNTER — Other Ambulatory Visit: Payer: Self-pay

## 2020-03-13 ENCOUNTER — Emergency Department (HOSPITAL_COMMUNITY): Payer: Self-pay

## 2020-03-13 DIAGNOSIS — Z20822 Contact with and (suspected) exposure to covid-19: Secondary | ICD-10-CM | POA: Insufficient documentation

## 2020-03-13 DIAGNOSIS — J040 Acute laryngitis: Secondary | ICD-10-CM

## 2020-03-13 DIAGNOSIS — R059 Cough, unspecified: Secondary | ICD-10-CM | POA: Insufficient documentation

## 2020-03-13 DIAGNOSIS — J029 Acute pharyngitis, unspecified: Secondary | ICD-10-CM | POA: Insufficient documentation

## 2020-03-13 DIAGNOSIS — F17210 Nicotine dependence, cigarettes, uncomplicated: Secondary | ICD-10-CM | POA: Insufficient documentation

## 2020-03-13 LAB — GROUP A STREP BY PCR: Group A Strep by PCR: NOT DETECTED

## 2020-03-13 MED ORDER — ACETAMINOPHEN 325 MG PO TABS
650.0000 mg | ORAL_TABLET | Freq: Once | ORAL | Status: AC | PRN
  Administered 2020-03-13: 650 mg via ORAL
  Filled 2020-03-13: qty 2

## 2020-03-13 MED ORDER — ESOMEPRAZOLE MAGNESIUM 40 MG PO CPDR: 40.0000 mg | DELAYED_RELEASE_CAPSULE | Freq: Every day | ORAL | 0 refills | Status: AC

## 2020-03-13 MED ORDER — FAMOTIDINE 20 MG PO TABS
20.0000 mg | ORAL_TABLET | Freq: Once | ORAL | Status: AC
  Administered 2020-03-13: 20 mg via ORAL
  Filled 2020-03-13: qty 1

## 2020-03-13 MED ORDER — ALUM & MAG HYDROXIDE-SIMETH 200-200-20 MG/5ML PO SUSP
30.0000 mL | Freq: Once | ORAL | Status: AC
  Administered 2020-03-13: 30 mL via ORAL
  Filled 2020-03-13: qty 30

## 2020-03-13 MED ORDER — LIDOCAINE VISCOUS HCL 2 % MT SOLN
15.0000 mL | Freq: Once | OROMUCOSAL | Status: AC
  Administered 2020-03-13: 15 mL via ORAL
  Filled 2020-03-13: qty 15

## 2020-03-13 NOTE — ED Triage Notes (Signed)
Pt POV reports sore throat x2 years, but worsening 2 weeks. Reports new cough starting yesterday.

## 2020-03-13 NOTE — ED Notes (Signed)
Pt able to tolerate PO fluids.  

## 2020-03-13 NOTE — ED Notes (Signed)
Pt stated he was waiting on a phone call to be seen from his car. Pt now back in lobby.

## 2020-03-13 NOTE — ED Provider Notes (Signed)
[facility]-EMERGENCY DEPT Provider Note   CSN: [id number] Arrival date & time: 03/13/20  1535     History Chief Complaint  Patient presents with  . Sore Throat  . Cough    Nathaniel Werner is a 40 y.o. male who presented with sore throat, cough.  Patient states that he has been having sore throat for the last 2 years.  He states that over the last 2 weeks, it got worse.  He went to [facility] about a week ago and left without being seen.  Patient has some nonproductive cough as well.  Patient is not vaccinated against COVID and denies any shortness of breath.  Denies any fever.  States that his voice is hoarse but has been hoarse for the last several weeks.   The history is provided by the patient.       No past medical history on file.  There are no problems to display for this patient.   Past Surgical History:  Procedure Laterality Date  . FRACTURE SURGERY         No family history on file.  Social History   Tobacco Use  . Smoking status: Current Every Day Smoker    Packs/day: 1.00    Types: Cigarettes  . Smokeless tobacco: Never Used  Substance Use Topics  . Alcohol use: Yes    Comment: occasionally    Home Medications Prior to Admission medications   Not on File    Allergies    Wasp venom, Blue dyes (parenteral), and Morphine and related  Review of Systems   Review of Systems  HENT: Positive for sore throat.   Respiratory: Positive for cough.   All other systems reviewed and are negative.   Physical Exam Updated Vital Signs BP (!) 142/92   Pulse 98   Temp 98.8 F (37.1 C) (Oral)   Resp 18   Ht 5\' 4"  (1.626 m)   Wt 67.2 kg   SpO2 99%   BMI 25.42 kg/m   Physical Exam Vitals and nursing note reviewed.  Constitutional:      Appearance: He is well-developed.  HENT:     Head: Normocephalic.     Mouth/Throat:     Pharynx: Posterior oropharyngeal erythema present.     Comments: No tonsillar exudates  Eyes:      Conjunctiva/sclera: Conjunctivae normal.     Pupils: Pupils are equal, round, and reactive to light.  Cardiovascular:     Rate and Rhythm: Normal rate and regular rhythm.  Pulmonary:     Effort: Pulmonary effort is normal.     Breath sounds: Normal breath sounds.  Abdominal:     General: Bowel sounds are normal.     Palpations: Abdomen is soft.  Musculoskeletal:     Cervical back: Normal range of motion and neck supple.  Skin:    General: Skin is warm.     Capillary Refill: Capillary refill takes less than 2 seconds.  Neurological:     General: No focal deficit present.     Mental Status: He is alert and oriented to person, place, and time.  Psychiatric:        Mood and Affect: Mood normal.        Behavior: Behavior normal.     ED Results / Procedures / Treatments   Labs (all labs ordered are listed, but only abnormal results are displayed) Labs Reviewed  GROUP A STREP BY PCR  SARS CORONAVIRUS 2 (TAT 6-24 HRS)  EKG None  Radiology No results found.  Procedures Procedures (including critical care time)  Medications Ordered in ED Medications  acetaminophen (TYLENOL) tablet 650 mg (has no administration in time range)  alum & mag hydroxide-simeth (MAALOX/MYLANTA) 200-200-20 MG/5ML suspension 30 mL (has no administration in time range)    And  lidocaine (XYLOCAINE) 2 % viscous mouth solution 15 mL (has no administration in time range)  famotidine (PEPCID) tablet 20 mg (has no administration in time range)    ED Course  I have reviewed the triage vital signs and the nursing notes.  Pertinent labs & imaging results that were available during my care of the patient were reviewed by me and considered in my medical decision making (see chart for details).    MDM Rules/Calculators/A&P                         JATHEN SUDANO is a 40 y.o. male here with cough and sore throat.  Has been going on for years and worse over the last 2 weeks.  I suspect reflux versus vocal  cord dysfunction versus Covid.  Patient is afebrile though and not hypoxic.  Plan to get soft tissue neck x-ray and give GI cocktail and Pepcid and test for Covid  10:21 PM Chest x-ray and neck x-ray unremarkable.  Strep test is negative.  Covid test is sent.  Will start on PPI and told him to stay home until Covid test comes back.  Will refer to ENT.  He may have laryngitis or vocal cord dysfunction or reflux.  He may benefit from Nasopharyngeal scope.   Final Clinical Impression(s) / ED Diagnoses Final diagnoses:  None    Rx / DC Orders ED Discharge Orders    None       Charlynne Pander, MD 03/13/20 2222

## 2020-03-13 NOTE — Discharge Instructions (Addendum)
Take nexium daily.   You may have vocal cord dysfunction or reflux.  Consider following up with a ENT doctor for a scope.  Return to ER if you have worse sore throat, trouble swallowing, neck swelling

## 2020-03-13 NOTE — ED Notes (Signed)
Pt called 3x for room placement. Eloped from waiting area.  

## 2020-03-13 NOTE — ED Notes (Signed)
An After Visit Summary was printed and given to the patient. Discharge instructions given and no further questions at this time.  

## 2020-03-13 NOTE — ED Notes (Signed)
Pt ambulated in room unassisted with a steady gait. Pt O2 reading 99% RA while ambulating.

## 2020-03-14 LAB — SARS CORONAVIRUS 2 (TAT 6-24 HRS): SARS Coronavirus 2: NEGATIVE
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
## 2020-04-09 ENCOUNTER — Ambulatory Visit: Payer: Self-pay | Admitting: Neurology

## 2021-05-10 IMAGING — CR DG CHEST 2V
2 series · 2 of 2 positions shown · non-contrast
Comparison: 02/28/2019

CLINICAL DATA: Sore throat, chest tightness, short of breath,
hoarseness

EXAM:
CHEST - 2 VIEW

[w chest pa]
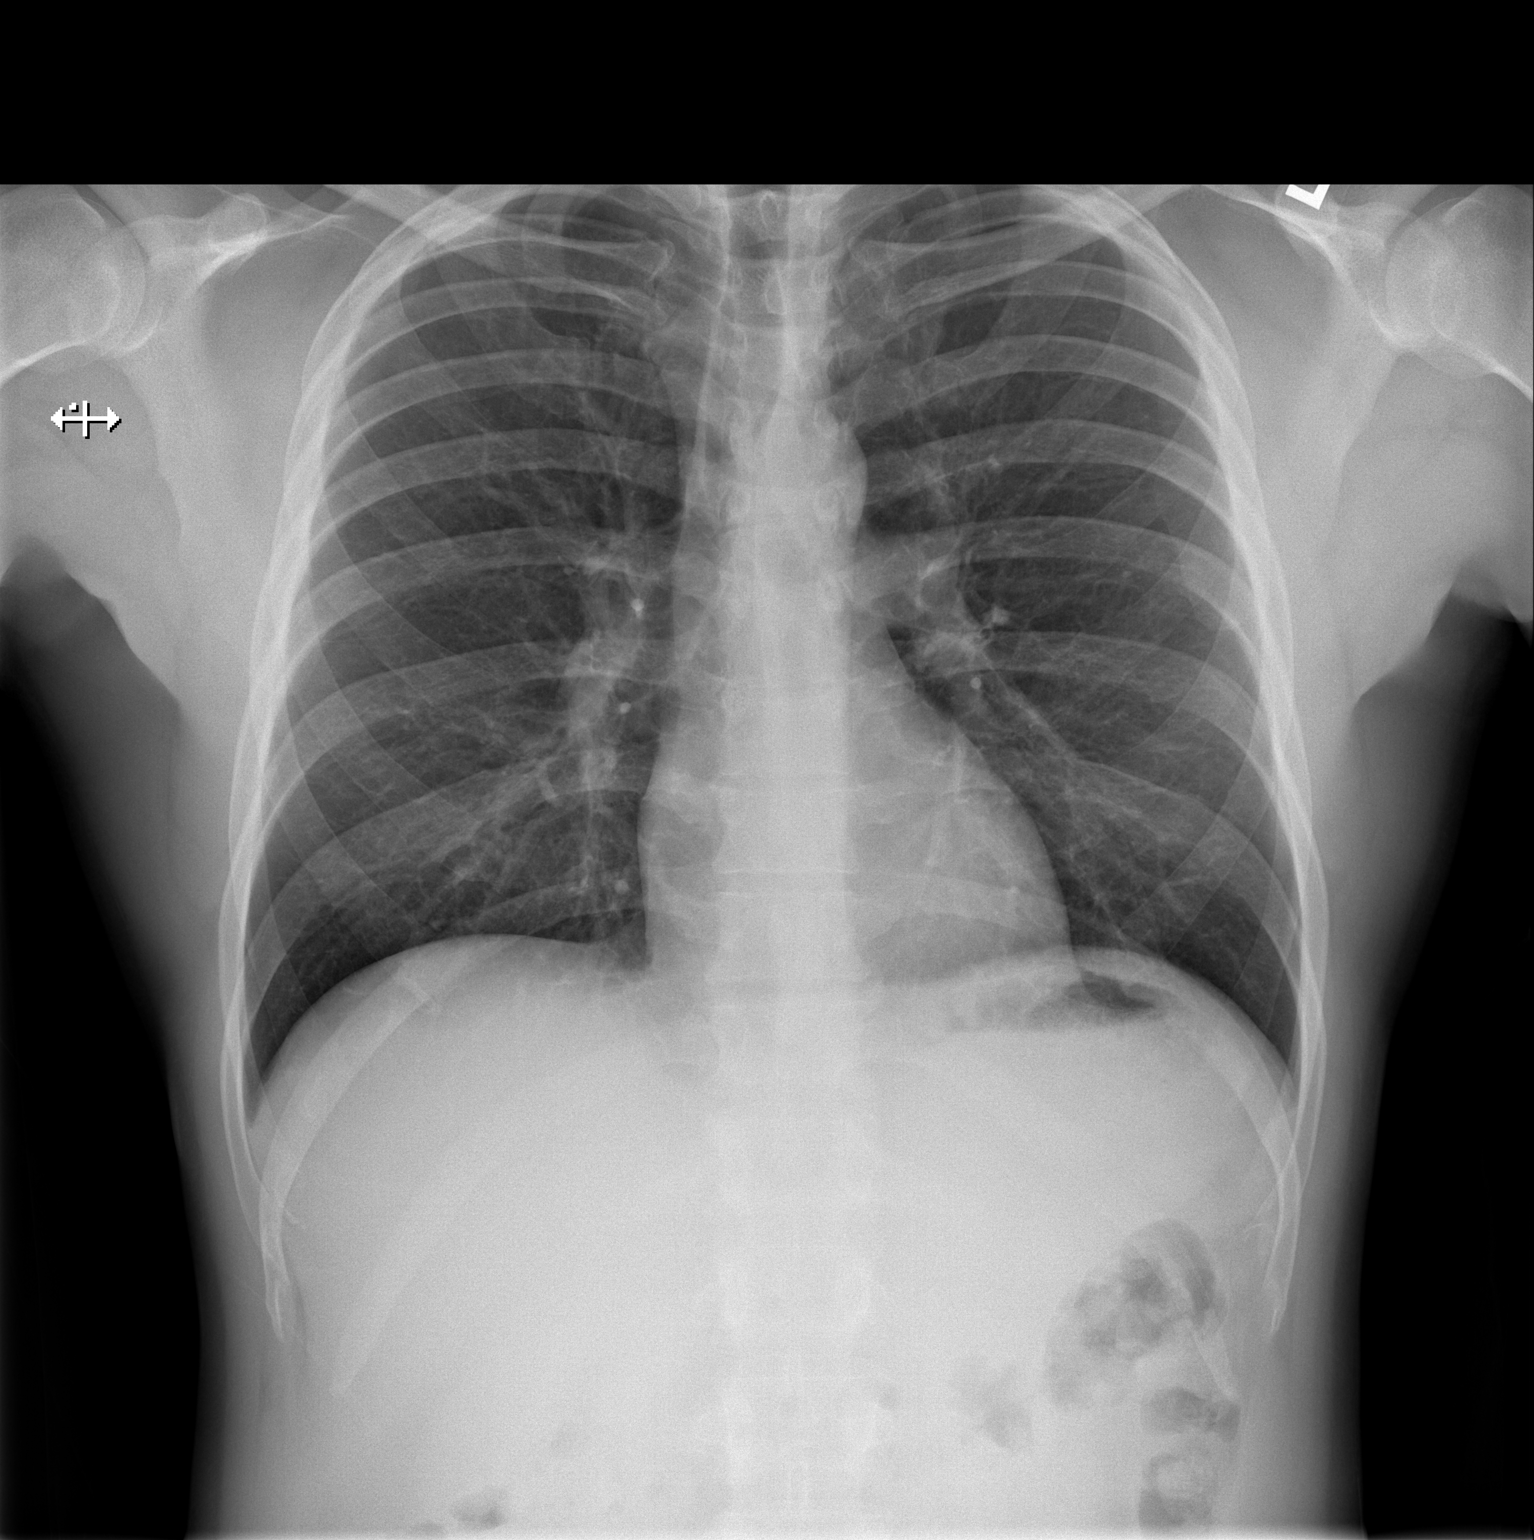

[w chest lat]
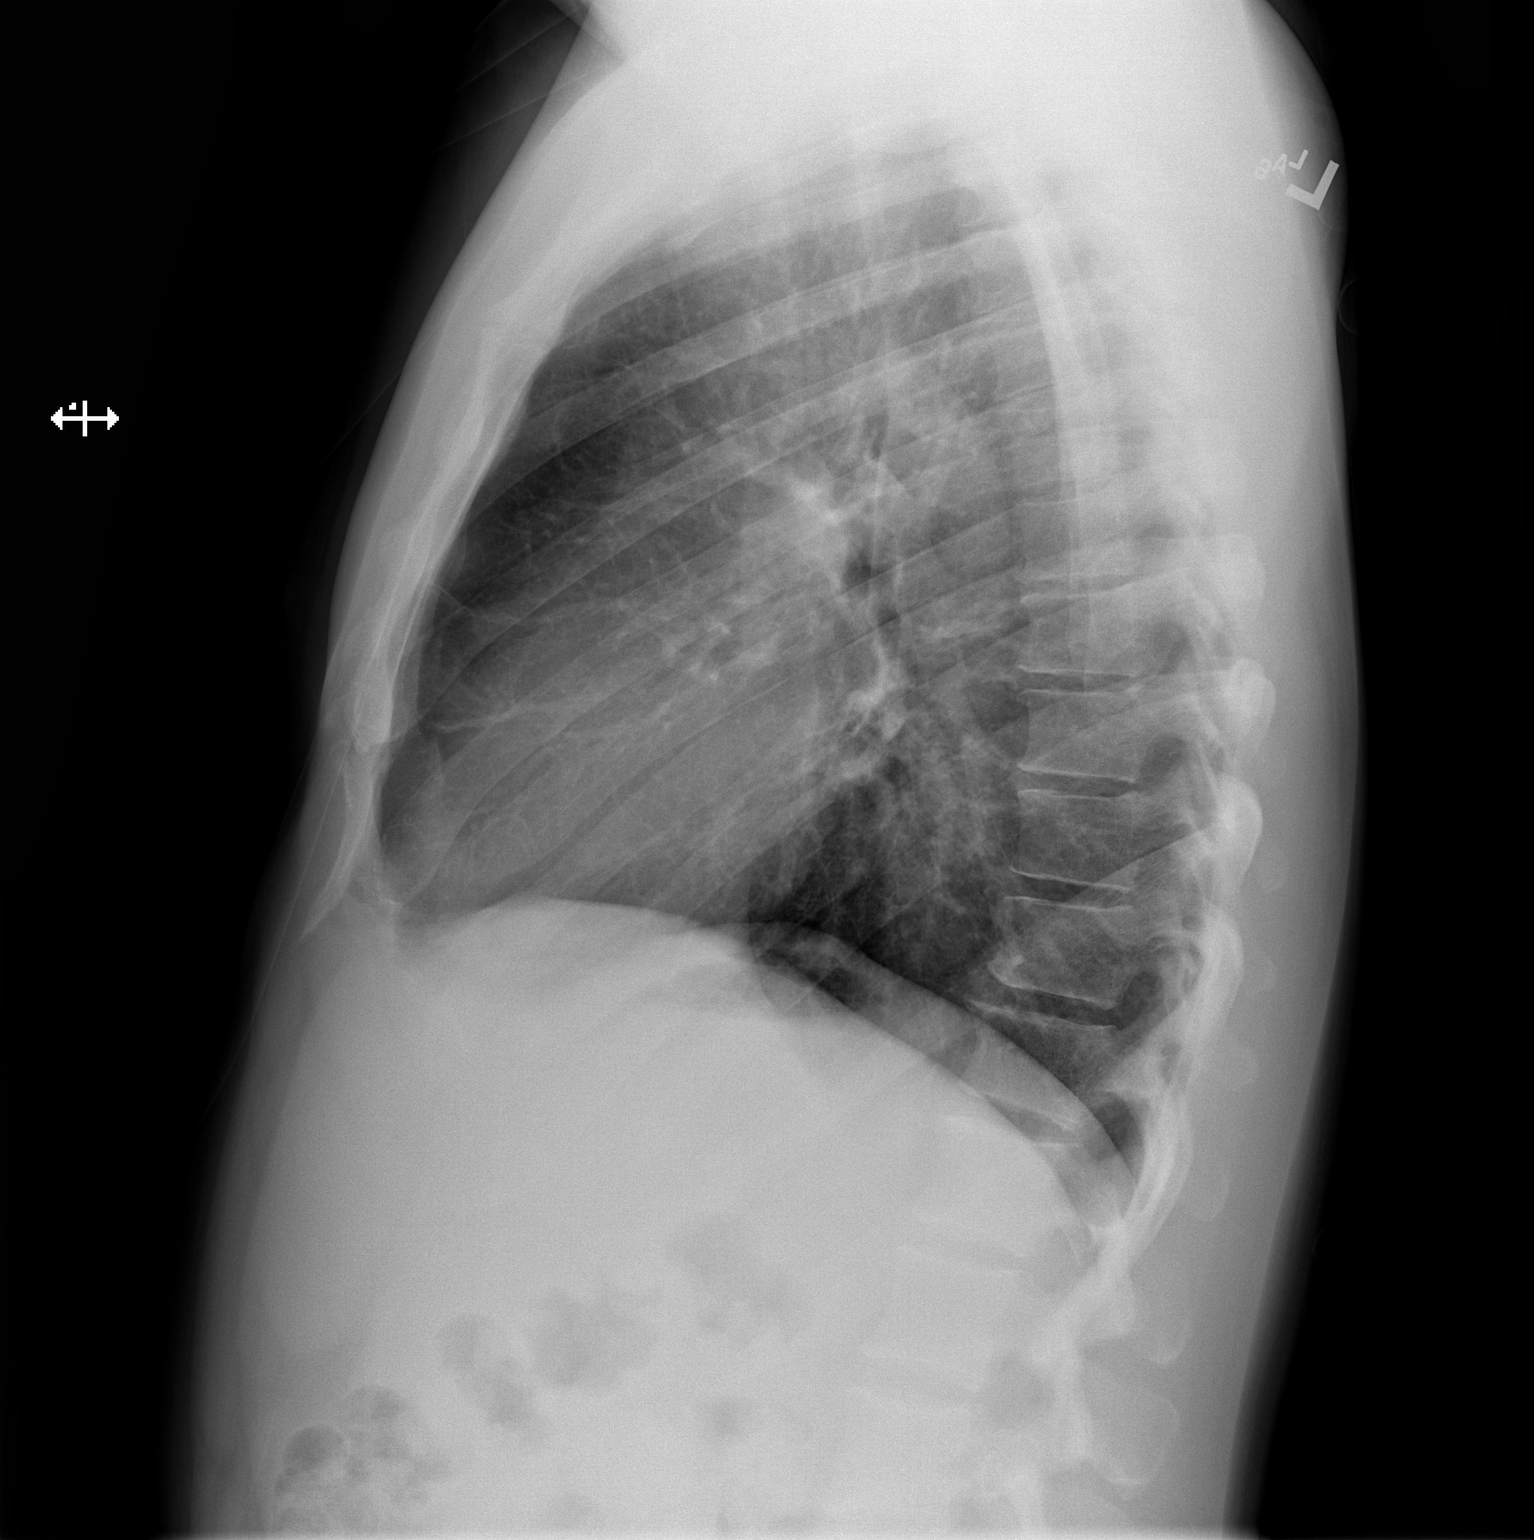

[2 of 2 positions shown; findings below may reference images not displayed]

FINDINGS: Frontal and lateral views of the chest demonstrate an unremarkable
cardiac silhouette. No acute airspace disease, effusion, or
pneumothorax. No acute bony abnormalities.
IMPRESSION: 1. No acute intrathoracic process.

## 2021-05-10 IMAGING — CR DG NECK SOFT TISSUE
2 series · 2 of 2 positions shown · non-contrast
Comparison: None.

CLINICAL DATA: 39-year-old male with neck pain.

EXAM:
NECK SOFT TISSUES - 1+ VIEW

[w soft tissue neck ap]
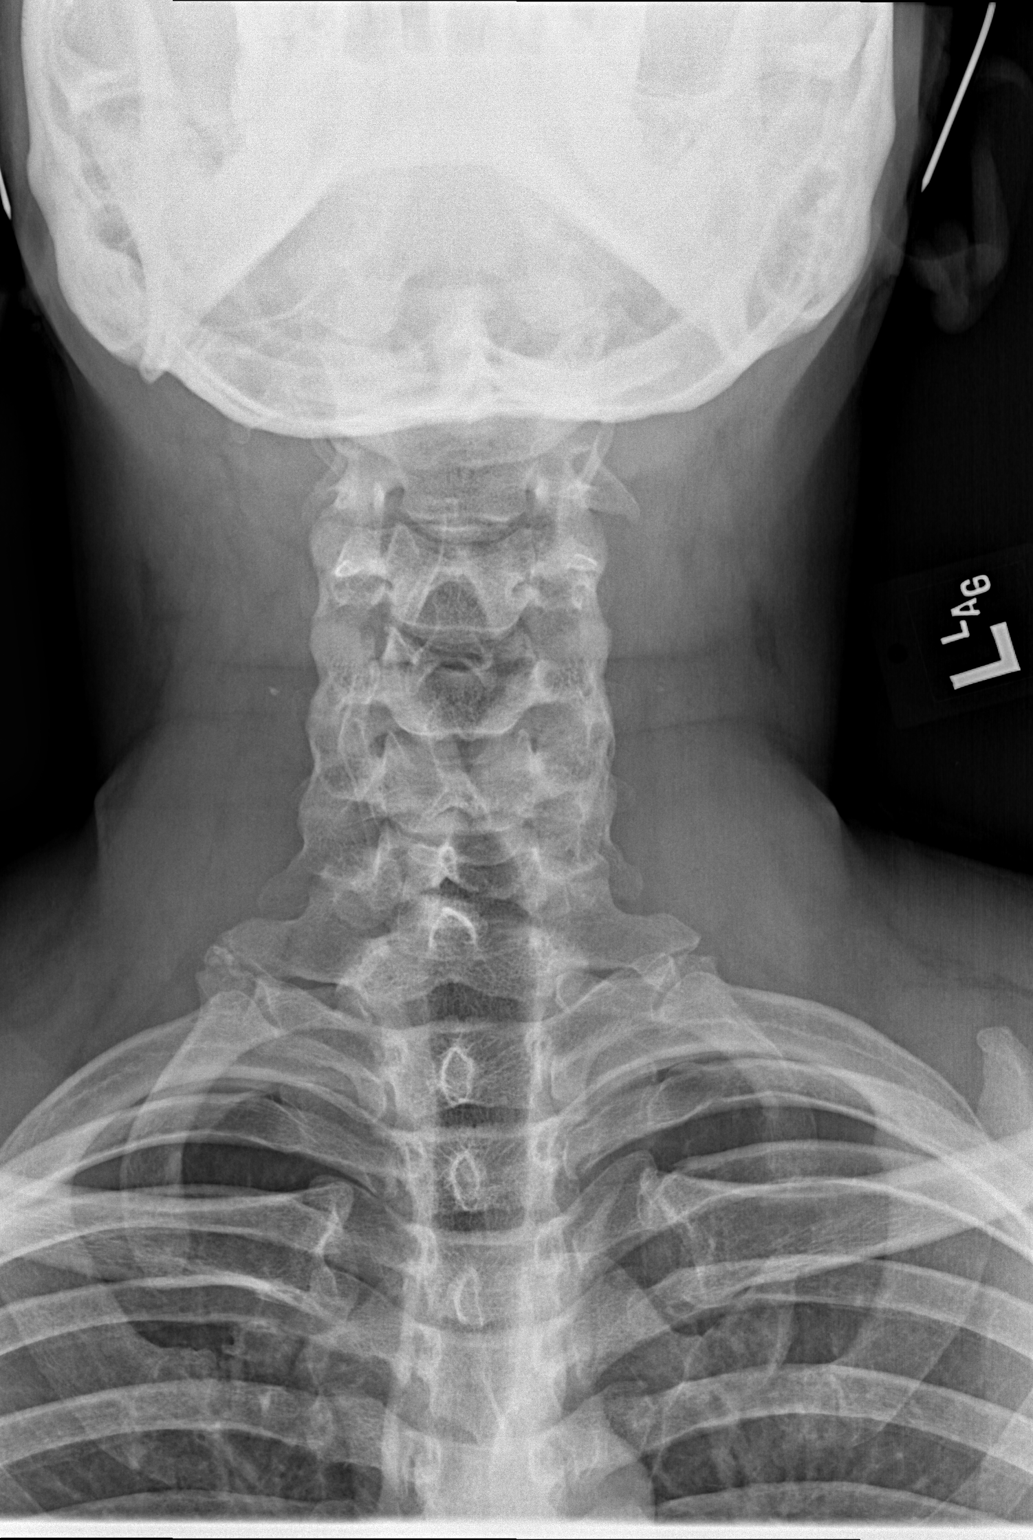

[w soft tissue neck lat]
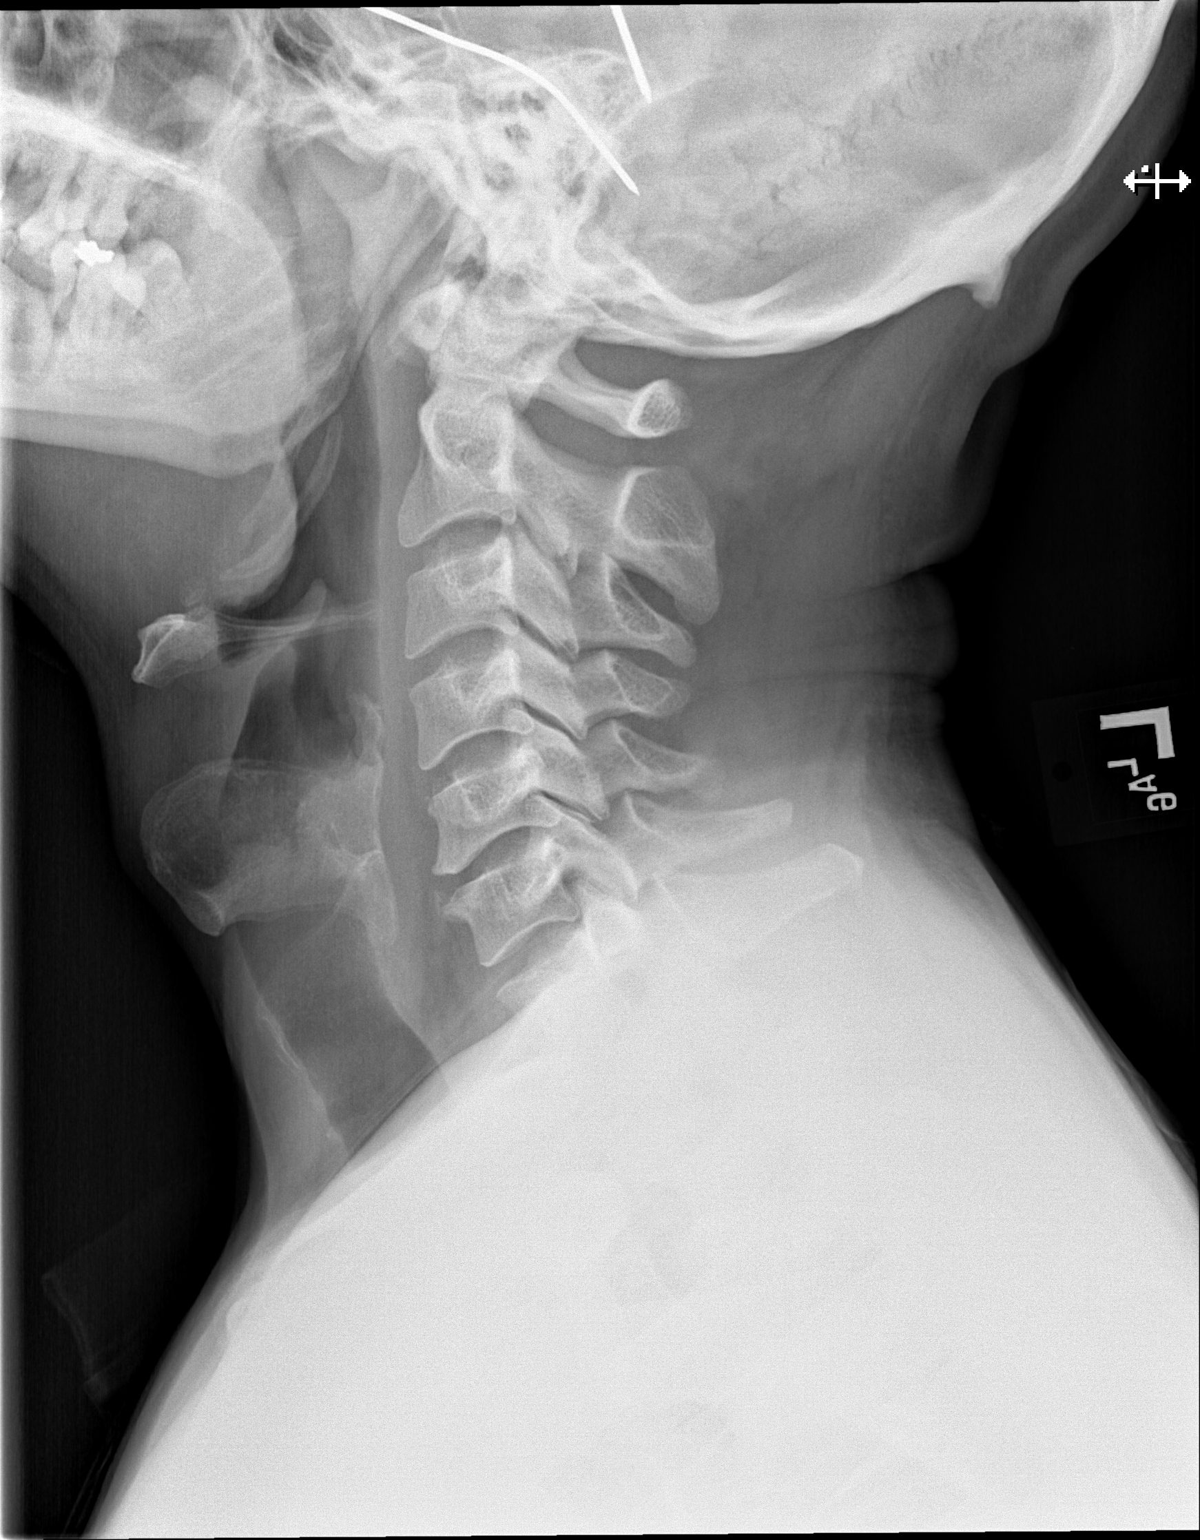

[2 of 2 positions shown; findings below may reference images not displayed]

FINDINGS: There is no evidence of retropharyngeal soft tissue swelling or
epiglottic enlargement. The cervical airway is unremarkable and no
radio-opaque foreign body identified. Mild degenerative changes at
C5-C6 with spurring.
IMPRESSION: No acute findings.

## 2023-08-30 ENCOUNTER — Encounter (HOSPITAL_COMMUNITY): Payer: Self-pay

## 2023-08-30 ENCOUNTER — Emergency Department (HOSPITAL_COMMUNITY)
Admission: EM | Admit: 2023-08-30 | Discharge: 2023-08-30 | Disposition: A | Discharge status: Home / Self Care | Payer: Self-pay | Attending: Emergency Medicine | Admitting: Emergency Medicine

## 2023-08-30 DIAGNOSIS — T7840XA Allergy, unspecified, initial encounter: Secondary | ICD-10-CM | POA: Diagnosis present

## 2023-08-30 MED ORDER — METHYLPREDNISOLONE SODIUM SUCC 125 MG IJ SOLR
125.0000 mg | INTRAMUSCULAR | Status: AC
  Administered 2023-08-30: 125 mg via INTRAVENOUS
  Filled 2023-08-30: qty 2

## 2023-08-30 MED ORDER — FAMOTIDINE IN NACL 20-0.9 MG/50ML-% IV SOLN
20.0000 mg | Freq: Once | INTRAVENOUS | Status: AC
  Administered 2023-08-30: 20 mg via INTRAVENOUS
  Filled 2023-08-30: qty 50

## 2023-08-30 NOTE — Discharge Instructions (Addendum)
 It was a pleasure taking part in your care.  As discussed, please follow-up with the allergy and asthma center.  Please call today and make an appointment to be seen for allergy testing.  I have sent an EpiPen  to your pharmacy on file.  If you experience an allergic reaction and feel as if you cannot swallow, please administer EpiPen .  Please return to the ED with any new or worsening symptoms.

## 2023-08-30 NOTE — ED Triage Notes (Signed)
 Pt BIB EMS for an allergic reaction started at 12 tonight. Pt states trying a new soap and eating BBQ ribs prior to going to sleep. Pt took the benadryl around 12:30 right before call ems to help relieve his itching, but states it did not help. Pt also states having beer with his benadryl.  EMS report pt was SOB and wheezing so he received albuterol neb. Pt also received 0.3 ml of epi, and 50mg   benadryl PO with ems.

## 2023-08-30 NOTE — ED Provider Notes (Signed)
 [place] [facility] DEPARTMENT AT [facility] Provider Note   CSN: [id number] Arrival date & time: 08/30/23  9866     Patient presents with: Allergic Reaction   Nathaniel Werner is a 43 y.o. male with no documented medical history.  The patient presents to the ED for evaluation of allergic reaction.  The patient reports that around 11:30 PM he was lying down to go to sleep when he noticed that his entire body became itchy.  He reports he woke up and noticed that he had hives all over his body to include his trunk, bilateral upper extremities, bilateral lower extremities.  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  States that it was when he was stung by a wasp.  He has never followed up with an allergist.  He is unsure what he is allergic to.  He reports that about 3 hours prior to his symptoms beginning, he had just finished eating barbecue ribs with a new barbecue sauce he had purchased from the store.  He denies any recent changes to his usual detergents, soaps or lotions.  He denies any nausea, vomiting, abdominal pain at this time.  Denies any chest pain or shortness of breath.  Denies feeling as if he cannot swallow or his throat is closing.  He denies a history of alpha gal.  Reports he often eats meat without issue.  He reports that prior to EMS arrival he took 3 packets with 2 tablets of Benadryl each.  I believe the patient may have taken 150 mg of Benadryl.    Allergic Reaction Presenting symptoms: rash        Prior to Admission medications   Medication Sig Start Date End Date Taking? Authorizing Provider  esomeprazole  (NEXIUM ) 40 MG capsule Take 1 capsule (40 mg total) by mouth daily. 03/13/20   Patt Alm Macho, MD    Allergies: Wasp venom, Blue dyes (parenteral), and Morphine and codeine    Review of Systems  Skin:  Positive for rash.  All other systems reviewed and are negative.   Updated Vital Signs BP (!) 135/108 (BP Location: Right Arm)   Pulse  85   Temp (!) 97.5 F (36.4 C) (Oral)   Resp (!) 22   Ht 5' 5 (1.651 m)   Wt 72.6 kg   SpO2 100%   BMI 26.63 kg/m   Physical Exam Vitals and nursing note reviewed.  Constitutional:      General: He is not in acute distress.    Appearance: He is well-developed.  HENT:     Head: Normocephalic and atraumatic.     Mouth/Throat:     Comments: Uvula midline.  Handling secretions appropriately.  No drooling, no change phonation.  No facial swelling.  Eyes:     Conjunctiva/sclera: Conjunctivae normal.    Cardiovascular:     Rate and Rhythm: Normal rate and regular rhythm.     Heart sounds: No murmur heard. Pulmonary:     Effort: Pulmonary effort is normal. No respiratory distress.     Breath sounds: Normal breath sounds.  Abdominal:     Palpations: Abdomen is soft.     Tenderness: There is no abdominal tenderness.   Musculoskeletal:        General: No swelling.     Cervical back: Neck supple.   Skin:    General: Skin is warm and dry.     Capillary Refill: Capillary refill takes less than 2 seconds.     Findings:  Rash present.     Comments: Urticarial rash to bilateral upper extremities, trunk, bilateral lower extremities   Neurological:     Mental Status: He is alert.   Psychiatric:        Mood and Affect: Mood normal.     (all labs ordered are listed, but only abnormal results are displayed) Labs Reviewed - No data to display  EKG: None  Radiology: No results found.   Procedures   Medications Ordered in the ED  methylPREDNISolone sodium succinate (SOLU-MEDROL) 125 mg/2 mL injection 125 mg (125 mg Intravenous Given 08/30/23 0219)  famotidine  (PEPCID ) IVPB 20 mg premix (0 mg Intravenous Stopped 08/30/23 0313)    Medical Decision Making Risk Prescription drug management.   43 year old male presents for evaluation.  Please see HPI for further details.  On examination the patient is afebrile and nontachycardic.  His lung sounds are clear bilaterally, he  is not hypoxic.  Abdomen is soft and compressible.  Neurological exam at baseline.  Urticarial hives noted to bilateral upper extremities, bilateral lower extremities, trunk.  No signs of respiratory distress.  Patient denies any feeling of throat closure.  He is handling secretions appropriately.  Patient reports that prior to arrival took 150 mg of Benadryl.  Will provide patient with 125 Solu-Medrol, 20 mg famotidine .  No indication for epinephrine  at this time.  Patient was observed for 4 hours after medications were administered.  The patient has had no recurrence of symptoms.  He states that his hives have resolved.  Patient will be discharged home at this time.  Will refer him to allergist.  Will provide him with epinephrine  pen to take home.  He was given return precautions and he voiced understanding.  He is stable to discharge.    Final diagnoses:  Allergic reaction, initial encounter    ED Discharge Orders     None          Ruthell Lonni JULIANNA DEVONNA 08/30/23 0544    Trine Raynell Moder, MD 08/30/23 [phone]
